# Patient Record
Sex: Female | Born: 1970 | Race: White | Hispanic: No | Marital: Married | Smoking: Current every day smoker
Health system: Southern US, Community
[De-identification: ages and names within clinical notes are randomized; demographics above are authoritative.]

## PROBLEM LIST (undated history)

## (undated) DIAGNOSIS — Z85820 Personal history of malignant melanoma of skin: Secondary | ICD-10-CM

## (undated) DIAGNOSIS — K219 Gastro-esophageal reflux disease without esophagitis: Secondary | ICD-10-CM

## (undated) DIAGNOSIS — R631 Polydipsia: Secondary | ICD-10-CM

## (undated) DIAGNOSIS — I10 Essential (primary) hypertension: Secondary | ICD-10-CM

## (undated) DIAGNOSIS — M790 Rheumatism, unspecified: Secondary | ICD-10-CM

## (undated) DIAGNOSIS — F419 Anxiety disorder, unspecified: Secondary | ICD-10-CM

## (undated) DIAGNOSIS — K52832 Lymphocytic colitis: Secondary | ICD-10-CM

## (undated) DIAGNOSIS — K52831 Collagenous colitis: Secondary | ICD-10-CM

## (undated) DIAGNOSIS — M797 Fibromyalgia: Secondary | ICD-10-CM

## (undated) HISTORY — DX: Collagenous colitis: K52.831

## (undated) HISTORY — DX: Lymphocytic colitis: K52.832

## (undated) HISTORY — DX: Anxiety disorder, unspecified: F41.9

## (undated) HISTORY — DX: Personal history of malignant melanoma of skin: Z85.820

## (undated) HISTORY — DX: Rheumatism, unspecified: M79.0

## (undated) HISTORY — DX: Essential (primary) hypertension: I10

## (undated) HISTORY — DX: Fibromyalgia: M79.7

## (undated) HISTORY — DX: Polydipsia: R63.1

## (undated) HISTORY — DX: Gastro-esophageal reflux disease without esophagitis: K21.9

---

## 2001-11-25 ENCOUNTER — Encounter: Payer: Self-pay | Admitting: Family Medicine

## 2001-11-25 ENCOUNTER — Ambulatory Visit (HOSPITAL_COMMUNITY): Admission: RE | Admit: 2001-11-25 | Discharge: 2001-11-25 | Payer: Self-pay | Admitting: Family Medicine

## 2003-09-22 ENCOUNTER — Other Ambulatory Visit: Admission: RE | Admit: 2003-09-22 | Discharge: 2003-09-22 | Payer: Self-pay | Admitting: Family Medicine

## 2004-01-12 ENCOUNTER — Other Ambulatory Visit: Admission: RE | Admit: 2004-01-12 | Discharge: 2004-01-12 | Payer: Self-pay | Admitting: Family Medicine

## 2005-01-29 ENCOUNTER — Other Ambulatory Visit: Admission: RE | Admit: 2005-01-29 | Discharge: 2005-01-29 | Payer: Self-pay | Admitting: Family Medicine

## 2011-08-13 DIAGNOSIS — Z85820 Personal history of malignant melanoma of skin: Secondary | ICD-10-CM | POA: Insufficient documentation

## 2017-06-07 ENCOUNTER — Ambulatory Visit
Admission: RE | Admit: 2017-06-07 | Discharge: 2017-06-07 | Disposition: A | Discharge status: Home / Self Care | Payer: No Typology Code available for payment source | Source: Ambulatory Visit | Attending: Family Medicine | Admitting: Family Medicine

## 2017-06-07 ENCOUNTER — Other Ambulatory Visit: Payer: Self-pay | Admitting: Family Medicine

## 2017-06-07 DIAGNOSIS — R042 Hemoptysis: Secondary | ICD-10-CM

## 2018-12-05 ENCOUNTER — Other Ambulatory Visit (HOSPITAL_COMMUNITY)
Admission: RE | Admit: 2018-12-05 | Discharge: 2018-12-05 | Disposition: A | Discharge status: Home / Self Care | Payer: PRIVATE HEALTH INSURANCE | Source: Ambulatory Visit | Attending: Physician Assistant | Admitting: Physician Assistant

## 2018-12-05 ENCOUNTER — Other Ambulatory Visit: Payer: Self-pay | Admitting: Physician Assistant

## 2018-12-05 DIAGNOSIS — Z0000 Encounter for general adult medical examination without abnormal findings: Secondary | ICD-10-CM | POA: Insufficient documentation

## 2018-12-09 LAB — CYTOLOGY - PAP
Diagnosis: NEGATIVE
HPV: NOT DETECTED

## 2018-12-15 ENCOUNTER — Other Ambulatory Visit: Payer: Self-pay | Admitting: Family Medicine

## 2018-12-15 DIAGNOSIS — Z1231 Encounter for screening mammogram for malignant neoplasm of breast: Secondary | ICD-10-CM

## 2019-01-28 ENCOUNTER — Ambulatory Visit: Payer: No Typology Code available for payment source

## 2019-03-13 ENCOUNTER — Ambulatory Visit
Admission: RE | Admit: 2019-03-13 | Discharge: 2019-03-13 | Disposition: A | Discharge status: Home / Self Care | Payer: PRIVATE HEALTH INSURANCE | Source: Ambulatory Visit | Attending: Family Medicine | Admitting: Family Medicine

## 2019-03-13 ENCOUNTER — Other Ambulatory Visit: Payer: Self-pay

## 2019-03-13 DIAGNOSIS — Z1231 Encounter for screening mammogram for malignant neoplasm of breast: Secondary | ICD-10-CM

## 2019-03-17 ENCOUNTER — Other Ambulatory Visit: Payer: Self-pay | Admitting: Family Medicine

## 2019-03-17 DIAGNOSIS — R928 Other abnormal and inconclusive findings on diagnostic imaging of breast: Secondary | ICD-10-CM

## 2019-03-24 ENCOUNTER — Ambulatory Visit
Admission: RE | Admit: 2019-03-24 | Discharge: 2019-03-24 | Disposition: A | Discharge status: Home / Self Care | Payer: PRIVATE HEALTH INSURANCE | Source: Ambulatory Visit | Attending: Family Medicine | Admitting: Family Medicine

## 2019-03-24 ENCOUNTER — Other Ambulatory Visit: Payer: Self-pay

## 2019-03-24 DIAGNOSIS — R928 Other abnormal and inconclusive findings on diagnostic imaging of breast: Secondary | ICD-10-CM

## 2020-01-20 ENCOUNTER — Encounter: Payer: Self-pay | Admitting: Physical Medicine and Rehabilitation

## 2020-01-20 ENCOUNTER — Ambulatory Visit (INDEPENDENT_AMBULATORY_CARE_PROVIDER_SITE_OTHER): Payer: PRIVATE HEALTH INSURANCE | Admitting: Physical Medicine and Rehabilitation

## 2020-01-20 ENCOUNTER — Telehealth: Payer: Self-pay

## 2020-01-20 ENCOUNTER — Other Ambulatory Visit: Payer: Self-pay

## 2020-01-20 ENCOUNTER — Ambulatory Visit (INDEPENDENT_AMBULATORY_CARE_PROVIDER_SITE_OTHER): Payer: PRIVATE HEALTH INSURANCE

## 2020-01-20 VITALS — BP 144/99 | HR 97

## 2020-01-20 DIAGNOSIS — M5416 Radiculopathy, lumbar region: Secondary | ICD-10-CM | POA: Diagnosis not present

## 2020-01-20 DIAGNOSIS — M4316 Spondylolisthesis, lumbar region: Secondary | ICD-10-CM

## 2020-01-20 DIAGNOSIS — M5441 Lumbago with sciatica, right side: Secondary | ICD-10-CM

## 2020-01-20 DIAGNOSIS — G8929 Other chronic pain: Secondary | ICD-10-CM

## 2020-01-20 DIAGNOSIS — M51369 Other intervertebral disc degeneration, lumbar region without mention of lumbar back pain or lower extremity pain: Secondary | ICD-10-CM

## 2020-01-20 DIAGNOSIS — M419 Scoliosis, unspecified: Secondary | ICD-10-CM | POA: Diagnosis not present

## 2020-01-20 DIAGNOSIS — M5136 Other intervertebral disc degeneration, lumbar region: Secondary | ICD-10-CM

## 2020-01-20 MED ORDER — DIAZEPAM 5 MG PO TABS: ORAL_TABLET | ORAL | 0 refills | Status: DC

## 2020-01-20 MED ORDER — BACLOFEN 10 MG PO TABS: ORAL_TABLET | ORAL | 0 refills | Status: DC

## 2020-01-20 NOTE — Progress Notes (Signed)
Pain in posterior right thigh. Sometimes both thighs. Pain is constant. Sometimes has buttock pain.  Numeric Pain Rating Scale and Functional Assessment Average Pain 8 Pain Right Now 5 My pain is constant Pain is worse with: standing Pain improves with: rest   In the last MONTH (on 0-10 scale) has pain interfered with the following?  1. General activity like being  able to carry out your everyday physical activities such as walking, climbing stairs, carrying groceries, or moving a chair?  Rating(6)  2. Relation with others like being able to carry out your usual social activities and roles such as  activities at home, at work and in your community. Rating(8)  3. Enjoyment of life such that you have  been bothered by emotional problems such as feeling anxious, depressed or irritable?  Rating(7)

## 2020-01-20 NOTE — Telephone Encounter (Signed)
Patient called in to notify  Her mri appt is November 20th @3 :40pm [facility]

## 2020-01-20 NOTE — Progress Notes (Deleted)
6 

## 2020-01-25 NOTE — Telephone Encounter (Signed)
Scheduled for MRI review on 11/23.

## 2020-02-11 NOTE — Progress Notes (Signed)
[name] - 49 y.o. female MRN [id number]  Date of birth: 06-Oct-1970  Office Visit Note: Visit Date: 01/20/2020 PCP: Elias Else, MD Referred by: Elias Else, MD  Subjective: Chief Complaint  Patient presents with  . Right Thigh - Pain  . Left Thigh - Pain   HPI: Kaylee Thomas is a 49 y.o. female who comes in today For new patient evaluation management at the request of Aliene Beams, MD fo chronic worsening severe right lower back pain and right posterior thigh pain worse with standing and ambulating and better at rest.  This is been ongoing now for several years but now with many many months of just worsening to the point where it is really affecting her daily life.  She has had conservative care with her primary care physician with medication management including nonsteroidal anti-inflammatories as well as Flexeril muscle relaxer and activity modification and just time.  She reports no specific injury or trauma.  No bowel or bladder dysfunction no focal weakness but feels weak in the thighs at times.  She can get bilateral anterior lateral symptoms with prolonged walking.  No history of imaging at this point.  We did obtain x-rays of the lumbar spine today this is reviewed with her using images and spine model.  The details are related below.  She has had no prior lumbar surgery.  She regularly sees a massage therapist and some type of touch therapy that she describes to me today that does help temporarily but not any long-lasting relief.  She does have home exercise program with core strengthening.  Case is complicated by anxiety and history of malignant skin melanoma but no history of metastatic disease.  She is having no fevers chills or night sweats or specific night pain.  She is intolerant of a lot of medications use for pain in general.  She has not improved really over this time with good conservative care.  She rates her pain as an 8 out of 10 constant pain but really  worse with standing and ambulating.  Classic neurogenic claudication type symptoms.  She gets some relief if she is walking if she bends forward.  She does have a positive grocery cart sign.  No history of prior lumbar injections.  Review of Systems  Musculoskeletal: Positive for back pain and joint pain.       Right more than left radicular leg pain   Otherwise per HPI.  Assessment & Plan: Visit Diagnoses:  1. Chronic bilateral low back pain with right-sided sciatica   2. Lumbar radiculopathy   3. Spondylolisthesis of lumbar region   4. Scoliosis of lumbar spine, unspecified scoliosis type   5. Other intervertebral disc degeneration, lumbar region     Plan: Findings:  Chronic worsening severe low back pain with right posterior thigh pain and sometimes anterior lateral type thigh pain more of a neurogenic claudication type symptoms.  She reports 8 out of 10 pain which severely limits her daily activities.  This is really a constant pain but worse with standing and movement.  Based on clinical history and exam and imaging this appears to be a combination of facet mediated low back pain and likely lumbar stenosis.  She has pretty significant listhesis of L4 on L5 with degenerative disc height loss at this area.  This pain has continued despite conservative care through her primary care physician with medication management activity modification and therapy.  She has had home exercises that she is continued doing  pretty well.  Her case is complicated by intolerances and allergies to pain medications and anti-inflammatory.  No red flag complaints of bowel or bladder dysfunction no prior surgery.  At this point given the history of prior melanoma history of imaging today showing pretty significant listhesis and scoliosis I think the next step is really MRI of the lumbar spine.  This would be so that we could look at potential target for interventional spine injection versus potentially surgical referral if  this was high-grade stenosis.  She does have some anxiety with getting the MRI we will give her Valium for the MRI.  I am also going to try baclofen for her lower back as a muscle relaxer antispasmodic.    Meds & Orders:  Meds ordered this encounter  Medications  . diazepam (VALIUM) 5 MG tablet    Sig: Take 1 by mouth 1 hour  pre-procedure with very light food. May bring 2nd tablet to appointment.    Dispense:  2 tablet    Refill:  0  . baclofen (LIORESAL) 10 MG tablet    Sig: Take 1/2 to 1 by mouth every 8hrs as needed for spasm    Dispense:  60 tablet    Refill:  0    Orders Placed This Encounter  Procedures  . XR Lumbar Spine 2-3 Views  . MR LUMBAR SPINE WO CONTRAST    Follow-up: Return for MRI review after completion.   Procedures: No procedures performed  No notes on file   Clinical History: No specialty comments available.   She reports that she has been smoking. She has never used smokeless tobacco. No results for input(s): HGBA1C, LABURIC in the last 8760 hours.  Objective:  VS:  HT:    WT:   BMI:     BP:(!) 144/99  HR:97bpm  TEMP: ( )  RESP:  Physical Exam Constitutional:      General: She is not in acute distress.    Appearance: Normal appearance. She is not ill-appearing.  HENT:     Head: Normocephalic and atraumatic.     Right Ear: External ear normal.     Left Ear: External ear normal.  Eyes:     Extraocular Movements: Extraocular movements intact.  Cardiovascular:     Rate and Rhythm: Normal rate.     Pulses: Normal pulses.  Musculoskeletal:        General: Tenderness present.     Cervical back: Neck supple. No tenderness.     Right lower leg: No edema.     Left lower leg: No edema.     Comments: Patient is somewhat slow to rise from a seated position and she does have concordant low back pain with facet loading and extension rotation right more than left.  Patient has good distal strength with no pain over the greater trochanters.  No clonus or  focal weakness.  She has a negative slump test bilaterally.  She does ambulate with a forward flexed lumbar spine.  Skin:    Findings: No erythema, lesion or rash.  Neurological:     General: No focal deficit present.     Mental Status: She is alert and oriented to person, place, and time.     Sensory: Sensory deficit present.     Motor: No weakness or abnormal muscle tone.     Coordination: Coordination normal.     Gait: Gait normal.  Psychiatric:        Mood and Affect: Mood normal.  Behavior: Behavior normal.     Ortho Exam  Imaging: AP and lateral 2 view lumbar spine:AP and lateral lumbar spine x-ray shows rightward convex scoliosis centered at L3 with some rotatory component.  There is well-maintained lordosis but there is grade 1 several millimeter listhesis of L4 on L5 with degenerative disc height loss.  There is facet arthropathy throughout the lower lumbar spine.  Sacroiliac joints are well-maintained without sclerosis.  Hip joints show mild degenerative change right more than left.  Past Medical/Family/Surgical/Social History: Medications & Allergies reviewed per EMR, new medications updated. Patient Active Problem List   Diagnosis Date Noted  . History of malignant melanoma of skin 08/13/2011   Past Medical History:  Diagnosis Date  . Anxiety   . Anxiety   . Collagenous colitis   . GERD (gastroesophageal reflux disease)   . History of melanoma   . Hypertension   . Lymphocytic colitis   . Polydipsia   . Rheumatism and fibrositis    Family History  Problem Relation Age of Onset  . Breast cancer Mother   . Breast cancer Maternal Aunt    History reviewed. No pertinent surgical history. Social History   Occupational History  . Not on file  Tobacco Use  . Smoking status: Current Every Day Smoker  . Smokeless tobacco: Never Used  Substance and Sexual Activity  . Alcohol use: Not on file  . Drug use: Not on file  . Sexual activity: Not on file

## 2020-02-12 ENCOUNTER — Encounter: Payer: Self-pay | Admitting: Physical Medicine and Rehabilitation

## 2020-02-12 ENCOUNTER — Telehealth: Payer: Self-pay | Admitting: Physical Medicine and Rehabilitation

## 2020-02-12 NOTE — Telephone Encounter (Signed)
Patient called requesting a call back to cancel appt. Patient states her MRI thru insurance was denied. Please call patient about this matter at [phone].

## 2020-02-13 ENCOUNTER — Other Ambulatory Visit: Payer: PRIVATE HEALTH INSURANCE

## 2020-02-15 NOTE — Telephone Encounter (Signed)
Called patient to advise that appointment has been cancelled and that more notes were submitted to insurance for reconsideration for MRI.

## 2020-02-16 ENCOUNTER — Ambulatory Visit: Payer: PRIVATE HEALTH INSURANCE | Admitting: Physical Medicine and Rehabilitation

## 2020-02-17 ENCOUNTER — Telehealth: Payer: Self-pay

## 2020-02-17 NOTE — Telephone Encounter (Signed)
Called pt back and sch OV to discuss MRI review 12/21.

## 2020-02-17 NOTE — Telephone Encounter (Signed)
Patient  called she needs to schedule a appointment for a mri review. CB:[phone]

## 2020-03-09 ENCOUNTER — Ambulatory Visit
Admission: RE | Admit: 2020-03-09 | Discharge: 2020-03-09 | Disposition: A | Discharge status: Home / Self Care | Payer: PRIVATE HEALTH INSURANCE | Source: Ambulatory Visit | Attending: Physical Medicine and Rehabilitation | Admitting: Physical Medicine and Rehabilitation

## 2020-03-15 ENCOUNTER — Other Ambulatory Visit: Payer: Self-pay

## 2020-03-15 ENCOUNTER — Encounter: Payer: Self-pay | Admitting: Physical Medicine and Rehabilitation

## 2020-03-15 ENCOUNTER — Ambulatory Visit (INDEPENDENT_AMBULATORY_CARE_PROVIDER_SITE_OTHER): Payer: PRIVATE HEALTH INSURANCE | Admitting: Physical Medicine and Rehabilitation

## 2020-03-15 ENCOUNTER — Telehealth: Payer: Self-pay | Admitting: Physical Medicine and Rehabilitation

## 2020-03-15 VITALS — BP 155/97 | HR 78

## 2020-03-15 DIAGNOSIS — M47816 Spondylosis without myelopathy or radiculopathy, lumbar region: Secondary | ICD-10-CM

## 2020-03-15 DIAGNOSIS — M5441 Lumbago with sciatica, right side: Secondary | ICD-10-CM

## 2020-03-15 DIAGNOSIS — M5442 Lumbago with sciatica, left side: Secondary | ICD-10-CM

## 2020-03-15 DIAGNOSIS — M48062 Spinal stenosis, lumbar region with neurogenic claudication: Secondary | ICD-10-CM | POA: Diagnosis not present

## 2020-03-15 DIAGNOSIS — M4316 Spondylolisthesis, lumbar region: Secondary | ICD-10-CM | POA: Diagnosis not present

## 2020-03-15 DIAGNOSIS — G894 Chronic pain syndrome: Secondary | ICD-10-CM

## 2020-03-15 DIAGNOSIS — G8929 Other chronic pain: Secondary | ICD-10-CM

## 2020-03-15 MED ORDER — DIAZEPAM 5 MG PO TABS: ORAL_TABLET | ORAL | 0 refills | Status: AC

## 2020-03-15 NOTE — Telephone Encounter (Signed)
Pt not req Auth#. 

## 2020-03-15 NOTE — Progress Notes (Signed)
 .  Numeric Pain Rating Scale and Functional Assessment Average Pain 8   In the last MONTH (on 0-10 scale) has pain interfered with the following?  1. General activity like being  able to carry out your everyday physical activities such as walking, climbing stairs, carrying groceries, or moving a chair?  Rating(8)   

## 2020-03-15 NOTE — Telephone Encounter (Signed)
Is auth needed for bilateral L4 TF? Patient is scheduled for tomorrow, 12/22 with driver.

## 2020-03-16 ENCOUNTER — Ambulatory Visit (INDEPENDENT_AMBULATORY_CARE_PROVIDER_SITE_OTHER): Payer: PRIVATE HEALTH INSURANCE | Admitting: Physical Medicine and Rehabilitation

## 2020-03-16 ENCOUNTER — Encounter: Payer: Self-pay | Admitting: Physical Medicine and Rehabilitation

## 2020-03-16 ENCOUNTER — Ambulatory Visit: Payer: Self-pay

## 2020-03-16 VITALS — BP 133/94 | HR 82

## 2020-03-16 DIAGNOSIS — M48062 Spinal stenosis, lumbar region with neurogenic claudication: Secondary | ICD-10-CM | POA: Diagnosis not present

## 2020-03-16 MED ORDER — BACLOFEN 10 MG PO TABS: ORAL_TABLET | ORAL | 1 refills | Status: AC

## 2020-03-16 MED ORDER — METHYLPREDNISOLONE ACETATE 80 MG/ML IJ SUSP
80.0000 mg | Freq: Once | INTRAMUSCULAR | Status: AC
  Administered 2020-03-16: 13:00:00 80 mg

## 2020-03-16 NOTE — Progress Notes (Signed)
Here for planned bilateral L4 TF. No changes since OV yesterday.  Numeric Pain Rating Scale and Functional Assessment Average Pain 8   In the last MONTH (on 0-10 scale) has pain interfered with the following?  1. General activity like being  able to carry out your everyday physical activities such as walking, climbing stairs, carrying groceries, or moving a chair?  Rating(8)   +Driver, -BT, -Dye Allergies.

## 2020-03-16 NOTE — Progress Notes (Signed)
Kaylee Thomas - 49 y.o. female MRN [id number]  Date of birth: July 22, 1970  Office Visit Note: Visit Date: 03/15/2020 PCP: Elias Else, MD Referred by: Elias Else, MD  Subjective: Chief Complaint  Patient presents with  . Lower Back - Pain   HPI: Kaylee Thomas is a 49 y.o. female who comes in today For evaluation management of chronic worsening low back pain with severe pain in the buttocks and bilateral right more than left hip and legs posterior lateral.  Please see our prior notes for further details and justification.  We did order an MRI of the lumbar spine and this was originally denied but ultimately under appeal was completed.  We noticed on x-ray imaging when we saw her for evaluation the first time that she did have small listhesis of L4 on L5 and was having symptoms of radicular pain particularly with walking and standing.  By way of brief review she is sent to [place] by Dr. Tracie Harrier her primary physician.  She rates her pain as an 8 out of 10 pretty significant pain.  She does get pain down both legs posterior laterally without really paresthesia.  No red flag complaints or trauma.  No changes since I last saw her.  Symptoms worse with standing and ambulating better at rest and with leaning forward.  MRI reviewed with her today with spine models and imaging.  Report is reviewed below.  Patient does have significant lumbar stenosis at L4-5 with facet arthropathy which is significant with small grade 1 listhesis.  Review of Systems  Musculoskeletal: Positive for back pain.       Bilateral hip and leg pain  All other systems reviewed and are negative.  Otherwise per HPI.  Assessment & Plan: Visit Diagnoses:    ICD-10-CM   1. Spinal stenosis of lumbar region with neurogenic claudication  M48.062   2. Spondylosis without myelopathy or radiculopathy, lumbar region  M47.816   3. Spondylolisthesis of lumbar region  M43.16   4. Chronic bilateral low back pain with bilateral  sciatica  M54.42    M54.41    G89.29   5. Chronic pain syndrome  G89.4      Plan: Findings:  Chronic worsening severe low back pain and bilateral right more than left radicular leg pain consistent with neurogenic claudication with now known stenosis at L4-5 with grade 1 listhesis and facet arthropathy.  Her symptoms persist despite conservative care with physical therapy activity modification and medication management.  Unfortunately she is intolerant of most pain medications and does have skin irritation and rash from tramadol Toradol and hydrocodone.  Her case is complicated by some underlying anxiety and possibly central sensitization syndrome such as fibromyalgia.  She is actually gotten some relief with the baclofen that we gave her.  She does want refill of that medication.  We discussed treatment of stenosis and most of her symptoms seem consistent with a stenosis more than the arthritis although I think that is present as well.  The neck step is bilateral L4 transforaminal epidural steroid injection.  Depending on relief obviously could intermittently complete these with good relief if they were well separated throughout the year.  Diagnostically she gives her a lot of relief should be a good candidate for lumbar decompression.  With small grade 1 listhesis she may not require fusion.  She really does want to avoid surgery if possible.  Again no red flag symptoms of weakness.  We will schedule her for the injection using  fluoroscopic guidance.  We will give her preprocedure Valium.    Meds & Orders:  Meds ordered this encounter  Medications  . diazepam (VALIUM) 5 MG tablet    Sig: Take 1 by mouth 1 hour  pre-procedure with very light food. May bring 2nd tablet to appointment.    Dispense:  2 tablet    Refill:  0  . baclofen (LIORESAL) 10 MG tablet    Sig: Take 1/2 to 1 by mouth every 8hrs as needed for spasm    Dispense:  60 tablet    Refill:  1   No orders of the defined types were  placed in this encounter.   Follow-up: Return for Bilateral L4 transforaminal dural steroid injection.   Procedures: No procedures performed      Clinical History: MRI LUMBAR SPINE WITHOUT CONTRAST  TECHNIQUE: Multiplanar, multisequence MR imaging of the lumbar spine was performed. No intravenous contrast was administered.  COMPARISON:  Lumbar spine radiographs 01/20/2020  FINDINGS: Segmentation:  Standard.  Alignment: Mild lumbar levoscoliosis. Facet mediated anterolisthesis of L4 on L5 measuring 4 mm.  Vertebrae: No fracture or suspicious osseous lesion. Minimal degenerative endplate and left facet edema at L4-5.  Conus medullaris and cauda equina: Conus extends to the T12-L1 level. Conus and cauda equina appear normal.  Paraspinal and other soft tissues: Unremarkable.  Disc levels:  Disc desiccation throughout the lumbar spine with exception of L5-S1. Severe disc space narrowing at L4-5.  T12-L1: Negative.  L1-2: Trace disc bulging and mild facet hypertrophy without stenosis.  L2-3: Mild facet hypertrophy without disc herniation or stenosis.  L3-4: Trace disc bulging and mild facet hypertrophy without stenosis.  L4-5: Anterolisthesis with bulging uncovered disc and severe facet and ligamentum flavum hypertrophy result in moderate to severe spinal stenosis, moderate to severe bilateral lateral recess stenosis, and severe right and moderate left neural foraminal stenosis. Potential right L4 and bilateral L5 nerve root impingement. 2 mm cyst associated with the right ligamentum flavum which does not significantly contribute to the stenosis. Small to moderate right facet joint effusion.  L5-S1: Mild facet hypertrophy without disc herniation or stenosis.  IMPRESSION: 1. Severe L4-5 facet arthrosis with grade 1 anterolisthesis and moderate to severe spinal, lateral recess, and neural foraminal stenosis. 2. Mild disc and facet degeneration  elsewhere without stenosis.   Electronically Signed   By: Sebastian Ache M.D.   On: 03/10/2020 08:33   She reports that she has been smoking. She has never used smokeless tobacco. No results for input(s): HGBA1C, LABURIC in the last 8760 hours.  Objective:  VS:  HT:    WT:   BMI:     BP:(!) 155/97  HR:78bpm  TEMP: ( )  RESP:  Physical Exam Constitutional:      General: She is not in acute distress.    Appearance: Normal appearance. She is normal weight. She is not ill-appearing.  HENT:     Head: Normocephalic and atraumatic.     Right Ear: External ear normal.     Left Ear: External ear normal.     Nose: Nose normal. No congestion or rhinorrhea.  Eyes:     Extraocular Movements: Extraocular movements intact.  Cardiovascular:     Rate and Rhythm: Normal rate.     Pulses: Normal pulses.  Pulmonary:     Effort: Pulmonary effort is normal. No respiratory distress.  Abdominal:     General: There is no distension.     Palpations: Abdomen is soft.  Musculoskeletal:  General: Tenderness present. No deformity.     Right lower leg: No edema.     Left lower leg: No edema.     Comments: Patient has good distal strength with no pain over the greater trochanters.  No clonus or focal weakness. Patient somewhat slow to rise from a seated position to full extension.  There is concordant low back pain with facet loading and lumbar spine extension rotation.  There are no definitive trigger points but the patient is somewhat tender across the lower back and PSIS.  There is no pain with hip rotation.   Skin:    Findings: No erythema, lesion or rash.  Neurological:     General: No focal deficit present.     Mental Status: She is alert and oriented to person, place, and time.     Sensory: No sensory deficit.     Motor: No weakness or abnormal muscle tone.     Coordination: Coordination normal.     Gait: Gait normal.  Psychiatric:        Mood and Affect: Mood normal.         Behavior: Behavior normal.     Ortho Exam  Imaging: No results found.  Past Medical/Family/Surgical/Social History: Medications & Allergies reviewed per EMR, new medications updated. Patient Active Problem List   Diagnosis Date Noted  . History of malignant melanoma of skin 08/13/2011   Past Medical History:  Diagnosis Date  . Anxiety   . Anxiety   . Collagenous colitis   . GERD (gastroesophageal reflux disease)   . History of melanoma   . Hypertension   . Lymphocytic colitis   . Polydipsia   . Rheumatism and fibrositis    Family History  Problem Relation Age of Onset  . Breast cancer Mother   . Breast cancer Maternal Aunt    History reviewed. No pertinent surgical history. Social History   Occupational History  . Not on file  Tobacco Use  . Smoking status: Current Every Day Smoker  . Smokeless tobacco: Never Used  Substance and Sexual Activity  . Alcohol use: Not on file  . Drug use: Not on file  . Sexual activity: Not on file

## 2020-03-16 NOTE — Progress Notes (Signed)
Kaylee Thomas - 49 y.o. female MRN [id number]  Date of birth: Jan 27, 1971  Office Visit Note: Visit Date: 03/16/2020 PCP: Elias Else, MD Referred by: Elias Else, MD  Subjective: Chief Complaint  Patient presents with  . Lower Back - Pain   HPI:  Kaylee Thomas is a 49 y.o. female who comes in today for planned Bilateral L4-L5 Lumbar epidural steroid injection with fluoroscopic guidance.  The patient has failed conservative care including home exercise, medications, time and activity modification.  This injection will be diagnostic and hopefully therapeutic.  Please see requesting physician notes for further details and justification.   ROS Otherwise per HPI.  Assessment & Plan: Visit Diagnoses:    ICD-10-CM   1. Spinal stenosis of lumbar region with neurogenic claudication  M48.062 XR C-ARM NO REPORT    Epidural Steroid injection    methylPREDNISolone acetate (DEPO-MEDROL) injection 80 mg    Plan: No additional findings.   Meds & Orders:  Meds ordered this encounter  Medications  . methylPREDNISolone acetate (DEPO-MEDROL) injection 80 mg    Orders Placed This Encounter  Procedures  . XR C-ARM NO REPORT  . Epidural Steroid injection    Follow-up: No follow-ups on file.   Procedures: No procedures performed  Lumbosacral Transforaminal Epidural Steroid Injection - Sub-Pedicular Approach with Fluoroscopic Guidance  Patient: Kaylee Thomas      Date of Birth: 1970-05-17 MRN: [id number] PCP: Elias Else, MD      Visit Date: 03/16/2020   Universal Protocol:    Date/Time: 03/16/2020  Consent Given By: the patient  Position: PRONE  Additional Comments: Vital signs were monitored before and after the procedure. Patient was prepped and draped in the usual sterile fashion. The correct patient, procedure, and site was verified.   Injection Procedure Details:   Procedure diagnoses: Spinal stenosis of lumbar region with neurogenic claudication  [M48.062]    Meds Administered:  Meds ordered this encounter  Medications  . methylPREDNISolone acetate (DEPO-MEDROL) injection 80 mg    Laterality: Bilateral  Location/Site:  L4-L5  Needle:5.0 in., 22 ga.  Short bevel or Quincke spinal needle  Needle Placement: Transforaminal  Findings:    -Comments: Excellent flow of contrast along the nerve, nerve root and into the epidural space.  Procedure Details: After squaring off the end-plates to get a true AP view, the C-arm was positioned so that an oblique view of the foramen as noted above was visualized. The target area is just inferior to the "nose of the scotty dog" or sub pedicular. The soft tissues overlying this structure were infiltrated with 2-3 ml. of 1% Lidocaine without Epinephrine.  The spinal needle was inserted toward the target using a "trajectory" view along the fluoroscope beam.  Under AP and lateral visualization, the needle was advanced so it did not puncture dura and was located close the 6 O'Clock position of the pedical in AP tracterory. Biplanar projections were used to confirm position. Aspiration was confirmed to be negative for CSF and/or blood. A 1-2 ml. volume of Isovue-250 was injected and flow of contrast was noted at each level. Radiographs were obtained for documentation purposes.   After attaining the desired flow of contrast documented above, a 0.5 to 1.0 ml test dose of 0.25% Marcaine was injected into each respective transforaminal space.  The patient was observed for 90 seconds post injection.  After no sensory deficits were reported, and normal lower extremity motor function was noted,   the above injectate was administered so that  equal amounts of the injectate were placed at each foramen (level) into the transforaminal epidural space.   Additional Comments:  The patient tolerated the procedure well Dressing: 2 x 2 sterile gauze and Band-Aid    Post-procedure details: Patient was observed during  the procedure. Post-procedure instructions were reviewed.  Patient left the clinic in stable condition.     Clinical History: MRI LUMBAR SPINE WITHOUT CONTRAST  TECHNIQUE: Multiplanar, multisequence MR imaging of the lumbar spine was performed. No intravenous contrast was administered.  COMPARISON:  Lumbar spine radiographs 01/20/2020  FINDINGS: Segmentation:  Standard.  Alignment: Mild lumbar levoscoliosis. Facet mediated anterolisthesis of L4 on L5 measuring 4 mm.  Vertebrae: No fracture or suspicious osseous lesion. Minimal degenerative endplate and left facet edema at L4-5.  Conus medullaris and cauda equina: Conus extends to the T12-L1 level. Conus and cauda equina appear normal.  Paraspinal and other soft tissues: Unremarkable.  Disc levels:  Disc desiccation throughout the lumbar spine with exception of L5-S1. Severe disc space narrowing at L4-5.  T12-L1: Negative.  L1-2: Trace disc bulging and mild facet hypertrophy without stenosis.  L2-3: Mild facet hypertrophy without disc herniation or stenosis.  L3-4: Trace disc bulging and mild facet hypertrophy without stenosis.  L4-5: Anterolisthesis with bulging uncovered disc and severe facet and ligamentum flavum hypertrophy result in moderate to severe spinal stenosis, moderate to severe bilateral lateral recess stenosis, and severe right and moderate left neural foraminal stenosis. Potential right L4 and bilateral L5 nerve root impingement. 2 mm cyst associated with the right ligamentum flavum which does not significantly contribute to the stenosis. Small to moderate right facet joint effusion.  L5-S1: Mild facet hypertrophy without disc herniation or stenosis.  IMPRESSION: 1. Severe L4-5 facet arthrosis with grade 1 anterolisthesis and moderate to severe spinal, lateral recess, and neural foraminal stenosis. 2. Mild disc and facet degeneration elsewhere without  stenosis.   Electronically Signed   By: Sebastian Ache M.D.   On: 03/10/2020 08:33     Objective:  VS:  HT:    WT:   BMI:     BP:(!) 133/94  HR:82bpm  TEMP: ( )  RESP:  Physical Exam   Imaging: No results found.

## 2020-03-16 NOTE — Procedures (Signed)
Lumbosacral Transforaminal Epidural Steroid Injection - Sub-Pedicular Approach with Fluoroscopic Guidance  Patient: Kaylee Thomas      Date of Birth: 11-04-1970 MRN: [id number] PCP: Elias Else, MD      Visit Date: 03/16/2020   Universal Protocol:    Date/Time: 03/16/2020  Consent Given By: the patient  Position: PRONE  Additional Comments: Vital signs were monitored before and after the procedure. Patient was prepped and draped in the usual sterile fashion. The correct patient, procedure, and site was verified.   Injection Procedure Details:   Procedure diagnoses: Spinal stenosis of lumbar region with neurogenic claudication [M48.062]    Meds Administered:  Meds ordered this encounter  Medications  . methylPREDNISolone acetate (DEPO-MEDROL) injection 80 mg    Laterality: Bilateral  Location/Site:  L4-L5  Needle:5.0 in., 22 ga.  Short bevel or Quincke spinal needle  Needle Placement: Transforaminal  Findings:    -Comments: Excellent flow of contrast along the nerve, nerve root and into the epidural space.  Procedure Details: After squaring off the end-plates to get a true AP view, the C-arm was positioned so that an oblique view of the foramen as noted above was visualized. The target area is just inferior to the "nose of the scotty dog" or sub pedicular. The soft tissues overlying this structure were infiltrated with 2-3 ml. of 1% Lidocaine without Epinephrine.  The spinal needle was inserted toward the target using a "trajectory" view along the fluoroscope beam.  Under AP and lateral visualization, the needle was advanced so it did not puncture dura and was located close the 6 O'Clock position of the pedical in AP tracterory. Biplanar projections were used to confirm position. Aspiration was confirmed to be negative for CSF and/or blood. A 1-2 ml. volume of Isovue-250 was injected and flow of contrast was noted at each level. Radiographs were obtained for  documentation purposes.   After attaining the desired flow of contrast documented above, a 0.5 to 1.0 ml test dose of 0.25% Marcaine was injected into each respective transforaminal space.  The patient was observed for 90 seconds post injection.  After no sensory deficits were reported, and normal lower extremity motor function was noted,   the above injectate was administered so that equal amounts of the injectate were placed at each foramen (level) into the transforaminal epidural space.   Additional Comments:  The patient tolerated the procedure well Dressing: 2 x 2 sterile gauze and Band-Aid    Post-procedure details: Patient was observed during the procedure. Post-procedure instructions were reviewed.  Patient left the clinic in stable condition.

## 2020-04-14 ENCOUNTER — Telehealth: Payer: Self-pay

## 2020-04-14 NOTE — Telephone Encounter (Signed)
Patient called she stated she received injection 12/22 and she is having leg pain she noticed the pain last week, she wants to know what's next. [phone]

## 2020-04-15 NOTE — Telephone Encounter (Signed)
Is auth needed for repeat bilateral L4 TF? Scheduled.

## 2020-04-15 NOTE — Telephone Encounter (Signed)
Bilateral L4 TF on 12/22. Please advise.

## 2020-04-15 NOTE — Telephone Encounter (Signed)
Need more info on if the injection helped and now the leg pain is back or what she is actually talking about with the leg pain.  Otherwise office visit or return to referring doctor.

## 2020-04-15 NOTE — Telephone Encounter (Signed)
Patient reports that the leg pain she had prior to the injection was greatly improved for several weeks after. Her back pain took longer to improve, but it was better. Leg pain returned last week. Ok to repeat?

## 2020-04-15 NOTE — Telephone Encounter (Signed)
Yes

## 2020-04-18 ENCOUNTER — Telehealth: Payer: Self-pay

## 2020-04-18 NOTE — Telephone Encounter (Signed)
Pt not req Auth # Ref# [id number]

## 2020-04-18 NOTE — Telephone Encounter (Signed)
Called pt and LVM #1 

## 2020-04-18 NOTE — Telephone Encounter (Signed)
Patient called she is returning your phone call CB:[phone]

## 2020-04-27 ENCOUNTER — Ambulatory Visit (INDEPENDENT_AMBULATORY_CARE_PROVIDER_SITE_OTHER): Payer: PRIVATE HEALTH INSURANCE | Admitting: Physical Medicine and Rehabilitation

## 2020-04-27 ENCOUNTER — Ambulatory Visit: Payer: Self-pay

## 2020-04-27 ENCOUNTER — Other Ambulatory Visit: Payer: Self-pay

## 2020-04-27 ENCOUNTER — Encounter: Payer: Self-pay | Admitting: Physical Medicine and Rehabilitation

## 2020-04-27 DIAGNOSIS — M48062 Spinal stenosis, lumbar region with neurogenic claudication: Secondary | ICD-10-CM | POA: Diagnosis not present

## 2020-04-27 MED ORDER — METHYLPREDNISOLONE ACETATE 80 MG/ML IJ SUSP
80.0000 mg | Freq: Once | INTRAMUSCULAR | Status: AC
  Administered 2020-04-27: 80 mg

## 2020-04-27 NOTE — Progress Notes (Signed)
Pt state lower back pain that travels posterior down both legs to her calfs. Pt state walking and standing makes the pain worse. Pt state she take pain meds to help ease the pain. Pt has hx of inj on 03/16/20 pt state it worked for two weeks.  Numeric Pain Rating Scale and Functional Assessment Average Pain 8   In the last MONTH (on 0-10 scale) has pain interfered with the following?  1. General activity like being  able to carry out your everyday physical activities such as walking, climbing stairs, carrying groceries, or moving a chair?  Rating(10)   +Driver, -BT, -Dye Allergies.

## 2020-04-27 NOTE — Patient Instructions (Signed)

## 2020-04-27 NOTE — Procedures (Signed)
Lumbosacral Transforaminal Epidural Steroid Injection - Sub-Pedicular Approach with Fluoroscopic Guidance  Patient: Kaylee Thomas      Date of Birth: Nov 07, 1970 MRN: [id number] PCP: Elias Else, MD      Visit Date: 04/27/2020   Universal Protocol:    Date/Time: 04/27/2020  Consent Given By: the patient  Position: PRONE  Additional Comments: Vital signs were monitored before and after the procedure. Patient was prepped and draped in the usual sterile fashion. The correct patient, procedure, and site was verified.   Injection Procedure Details:   Procedure diagnoses: Spinal stenosis of lumbar region with neurogenic claudication [M48.062]    Meds Administered:  Meds ordered this encounter  Medications  . methylPREDNISolone acetate (DEPO-MEDROL) injection 80 mg    Laterality: Bilateral  Location/Site:  L4-L5  Needle:5.0 in., 22 ga.  Short bevel or Quincke spinal needle  Needle Placement: Transforaminal  Findings:    -Comments: Excellent flow of contrast along the nerve, nerve root and into the epidural space.  Procedure Details: After squaring off the end-plates to get a true AP view, the C-arm was positioned so that an oblique view of the foramen as noted above was visualized. The target area is just inferior to the "nose of the scotty dog" or sub pedicular. The soft tissues overlying this structure were infiltrated with 2-3 ml. of 1% Lidocaine without Epinephrine.  The spinal needle was inserted toward the target using a "trajectory" view along the fluoroscope beam.  Under AP and lateral visualization, the needle was advanced so it did not puncture dura and was located close the 6 O'Clock position of the pedical in AP tracterory. Biplanar projections were used to confirm position. Aspiration was confirmed to be negative for CSF and/or blood. A 1-2 ml. volume of Isovue-250 was injected and flow of contrast was noted at each level. Radiographs were obtained for  documentation purposes.   After attaining the desired flow of contrast documented above, a 0.5 to 1.0 ml test dose of 0.25% Marcaine was injected into each respective transforaminal space.  The patient was observed for 90 seconds post injection.  After no sensory deficits were reported, and normal lower extremity motor function was noted,   the above injectate was administered so that equal amounts of the injectate were placed at each foramen (level) into the transforaminal epidural space.   Additional Comments:  The patient tolerated the procedure well Dressing: 2 x 2 sterile gauze and Band-Aid    Post-procedure details: Patient was observed during the procedure. Post-procedure instructions were reviewed.  Patient left the clinic in stable condition.

## 2020-04-27 NOTE — Progress Notes (Signed)
Kaylee Thomas - 50 y.o. female MRN [id number]  Date of birth: Aug 31, 1970  Office Visit Note: Visit Date: 04/27/2020 PCP: Elias Else, MD Referred by: Elias Else, MD  Subjective: Chief Complaint  Patient presents with  . Lower Back - Pain  . Left Leg - Pain  . Right Leg - Pain   HPI:  Kaylee Thomas is a 50 y.o. female who comes in today  for planned Bilateral L4-L5 Lumbar epidural steroid injection with fluoroscopic guidance.  The patient has failed conservative care including home exercise, medications, time and activity modification.  This injection will be diagnostic and hopefully therapeutic.  Please see requesting physician notes for further details and justification.   ROS Otherwise per HPI.  Assessment & Plan: Visit Diagnoses:    ICD-10-CM   1. Spinal stenosis of lumbar region with neurogenic claudication  M48.062 XR C-ARM NO REPORT    Epidural Steroid injection    methylPREDNISolone acetate (DEPO-MEDROL) injection 80 mg    Plan: No additional findings.   Meds & Orders:  Meds ordered this encounter  Medications  . methylPREDNISolone acetate (DEPO-MEDROL) injection 80 mg    Orders Placed This Encounter  Procedures  . XR C-ARM NO REPORT  . Epidural Steroid injection    Follow-up: Return in about 4 weeks (around 05/25/2020).   Procedures: No procedures performed  Lumbosacral Transforaminal Epidural Steroid Injection - Sub-Pedicular Approach with Fluoroscopic Guidance  Patient: Kaylee Thomas      Date of Birth: 1971-01-01 MRN: [id number] PCP: Elias Else, MD      Visit Date: 04/27/2020   Universal Protocol:    Date/Time: 04/27/2020  Consent Given By: the patient  Position: PRONE  Additional Comments: Vital signs were monitored before and after the procedure. Patient was prepped and draped in the usual sterile fashion. The correct patient, procedure, and site was verified.   Injection Procedure Details:   Procedure diagnoses:  Spinal stenosis of lumbar region with neurogenic claudication [M48.062]    Meds Administered:  Meds ordered this encounter  Medications  . methylPREDNISolone acetate (DEPO-MEDROL) injection 80 mg    Laterality: Bilateral  Location/Site:  L4-L5  Needle:5.0 in., 22 ga.  Short bevel or Quincke spinal needle  Needle Placement: Transforaminal  Findings:    -Comments: Excellent flow of contrast along the nerve, nerve root and into the epidural space.  Procedure Details: After squaring off the end-plates to get a true AP view, the C-arm was positioned so that an oblique view of the foramen as noted above was visualized. The target area is just inferior to the "nose of the scotty dog" or sub pedicular. The soft tissues overlying this structure were infiltrated with 2-3 ml. of 1% Lidocaine without Epinephrine.  The spinal needle was inserted toward the target using a "trajectory" view along the fluoroscope beam.  Under AP and lateral visualization, the needle was advanced so it did not puncture dura and was located close the 6 O'Clock position of the pedical in AP tracterory. Biplanar projections were used to confirm position. Aspiration was confirmed to be negative for CSF and/or blood. A 1-2 ml. volume of Isovue-250 was injected and flow of contrast was noted at each level. Radiographs were obtained for documentation purposes.   After attaining the desired flow of contrast documented above, a 0.5 to 1.0 ml test dose of 0.25% Marcaine was injected into each respective transforaminal space.  The patient was observed for 90 seconds post injection.  After no sensory deficits were reported, and  normal lower extremity motor function was noted,   the above injectate was administered so that equal amounts of the injectate were placed at each foramen (level) into the transforaminal epidural space.   Additional Comments:  The patient tolerated the procedure well Dressing: 2 x 2 sterile gauze and  Band-Aid    Post-procedure details: Patient was observed during the procedure. Post-procedure instructions were reviewed.  Patient left the clinic in stable condition.      Clinical History: MRI LUMBAR SPINE WITHOUT CONTRAST  TECHNIQUE: Multiplanar, multisequence MR imaging of the lumbar spine was performed. No intravenous contrast was administered.  COMPARISON:  Lumbar spine radiographs 01/20/2020  FINDINGS: Segmentation:  Standard.  Alignment: Mild lumbar levoscoliosis. Facet mediated anterolisthesis of L4 on L5 measuring 4 mm.  Vertebrae: No fracture or suspicious osseous lesion. Minimal degenerative endplate and left facet edema at L4-5.  Conus medullaris and cauda equina: Conus extends to the T12-L1 level. Conus and cauda equina appear normal.  Paraspinal and other soft tissues: Unremarkable.  Disc levels:  Disc desiccation throughout the lumbar spine with exception of L5-S1. Severe disc space narrowing at L4-5.  T12-L1: Negative.  L1-2: Trace disc bulging and mild facet hypertrophy without stenosis.  L2-3: Mild facet hypertrophy without disc herniation or stenosis.  L3-4: Trace disc bulging and mild facet hypertrophy without stenosis.  L4-5: Anterolisthesis with bulging uncovered disc and severe facet and ligamentum flavum hypertrophy result in moderate to severe spinal stenosis, moderate to severe bilateral lateral recess stenosis, and severe right and moderate left neural foraminal stenosis. Potential right L4 and bilateral L5 nerve root impingement. 2 mm cyst associated with the right ligamentum flavum which does not significantly contribute to the stenosis. Small to moderate right facet joint effusion.  L5-S1: Mild facet hypertrophy without disc herniation or stenosis.  IMPRESSION: 1. Severe L4-5 facet arthrosis with grade 1 anterolisthesis and moderate to severe spinal, lateral recess, and neural foraminal stenosis. 2. Mild  disc and facet degeneration elsewhere without stenosis.   Electronically Signed   By: Sebastian Ache M.D.   On: 03/10/2020 08:33     Objective:  VS:  HT:    WT:   BMI:     BP:   HR: bpm  TEMP: ( )  RESP:  Physical Exam Vitals and nursing note reviewed.  Constitutional:      General: She is not in acute distress.    Appearance: Normal appearance. She is not ill-appearing.  HENT:     Head: Normocephalic and atraumatic.     Right Ear: External ear normal.     Left Ear: External ear normal.  Eyes:     Extraocular Movements: Extraocular movements intact.  Cardiovascular:     Rate and Rhythm: Normal rate.     Pulses: Normal pulses.  Pulmonary:     Effort: Pulmonary effort is normal. No respiratory distress.  Abdominal:     General: There is no distension.     Palpations: Abdomen is soft.  Musculoskeletal:        General: Tenderness present.     Cervical back: Neck supple.     Right lower leg: No edema.     Left lower leg: No edema.     Comments: Patient has good distal strength with no pain over the greater trochanters.  No clonus or focal weakness.  Skin:    Findings: No erythema, lesion or rash.  Neurological:     General: No focal deficit present.     Mental Status: She is  alert and oriented to person, place, and time.     Sensory: No sensory deficit.     Motor: No weakness or abnormal muscle tone.     Coordination: Coordination normal.  Psychiatric:        Mood and Affect: Mood normal.        Behavior: Behavior normal.      Imaging: No results found.

## 2020-05-04 ENCOUNTER — Other Ambulatory Visit: Payer: Self-pay | Admitting: Family Medicine

## 2020-05-04 DIAGNOSIS — Z1231 Encounter for screening mammogram for malignant neoplasm of breast: Secondary | ICD-10-CM

## 2020-05-18 ENCOUNTER — Telehealth: Payer: Self-pay | Admitting: Physical Medicine and Rehabilitation

## 2020-05-18 ENCOUNTER — Encounter: Payer: Self-pay | Admitting: Physical Medicine and Rehabilitation

## 2020-05-18 ENCOUNTER — Other Ambulatory Visit: Payer: Self-pay

## 2020-05-18 ENCOUNTER — Ambulatory Visit (INDEPENDENT_AMBULATORY_CARE_PROVIDER_SITE_OTHER): Payer: PRIVATE HEALTH INSURANCE | Admitting: Physical Medicine and Rehabilitation

## 2020-05-18 VITALS — BP 140/87 | HR 83

## 2020-05-18 DIAGNOSIS — M4316 Spondylolisthesis, lumbar region: Secondary | ICD-10-CM | POA: Diagnosis not present

## 2020-05-18 DIAGNOSIS — M5416 Radiculopathy, lumbar region: Secondary | ICD-10-CM

## 2020-05-18 DIAGNOSIS — M48062 Spinal stenosis, lumbar region with neurogenic claudication: Secondary | ICD-10-CM

## 2020-05-18 NOTE — Progress Notes (Signed)
Bilateral L4 TF on 04/27/20. Relief for about a week. Great relief of pain for that week. Posterior thigh pain to knees. Staring to have pain into lower legs. Numeric Pain Rating Scale and Functional Assessment Average Pain 10   In the last MONTH (on 0-10 scale) has pain interfered with the following?  1. General activity like being  able to carry out your everyday physical activities such as walking, climbing stairs, carrying groceries, or moving a chair?  Rating(10)

## 2020-05-18 NOTE — Telephone Encounter (Signed)
Is auth needed for right L5-S1 IL? Scheduled for 2/24 with driver and no blood thinners.

## 2020-05-18 NOTE — Telephone Encounter (Signed)
Pt not req auth# 

## 2020-05-19 ENCOUNTER — Encounter: Payer: Self-pay | Admitting: Physical Medicine and Rehabilitation

## 2020-05-19 ENCOUNTER — Ambulatory Visit: Payer: Self-pay

## 2020-05-19 ENCOUNTER — Ambulatory Visit (INDEPENDENT_AMBULATORY_CARE_PROVIDER_SITE_OTHER): Payer: PRIVATE HEALTH INSURANCE | Admitting: Physical Medicine and Rehabilitation

## 2020-05-19 VITALS — BP 116/84 | HR 79

## 2020-05-19 DIAGNOSIS — M5416 Radiculopathy, lumbar region: Secondary | ICD-10-CM

## 2020-05-19 MED ORDER — BETAMETHASONE SOD PHOS & ACET 6 (3-3) MG/ML IJ SUSP
12.0000 mg | Freq: Once | INTRAMUSCULAR | Status: AC
  Administered 2020-05-19: 12 mg

## 2020-05-19 NOTE — Progress Notes (Signed)
Kaylee Thomas - 50 y.o. female MRN [id number]  Date of birth: 1970/07/04  Office Visit Note: Visit Date: 05/18/2020 PCP: Elias Else, MD Referred by: Elias Else, MD  Subjective: Chief Complaint  Patient presents with  . Lower Back - Pain   HPI: Kaylee Thomas is a 50 y.o. female who comes in today For evaluation management of chronic worsening severe low back pain and bilateral leg pain.  She is status post bilateral L4 transforaminal epidural steroid injection on 04/27/2020.  She says for about a week she had almost 100% relief in the symptoms returned.  First injection completed back in December lasted a little bit longer but did just as well from a pain relief standpoint diagnostically.  Patient has MRI findings of moderate severe multifactorial stenosis at L4-5 with small listhesis.  Otherwise she has normal-appearing spine.  She continues to do home exercises and takes Flexeril and Tylenol.  She has allergic reactions to hydrocodone Toradol and tramadol.  She has had no focal weakness no bowel or bladder symptoms no specific trauma no history of prior lumbar surgery.  She has had no red flag complaints.  Rates her pain as 10 out of 10 and limits what she can do functionally.  Does endorse some changes of pain even traveling past the knee more than it used to.  Review of Systems  Musculoskeletal: Positive for back pain.       Bilateral leg pain  All other systems reviewed and are negative.  Otherwise per HPI.  Assessment & Plan: Visit Diagnoses:    ICD-10-CM   1. Spinal stenosis of lumbar region with neurogenic claudication  M48.062 Ambulatory referral to Neurosurgery  2. Spondylolisthesis of lumbar region  M43.16 Ambulatory referral to Neurosurgery  3. Lumbar radiculopathy  M54.16 Ambulatory referral to Neurosurgery     Plan: Findings:  Chronic worsening severe 10 out of 10 pain in the low back and bilateral legs worse with standing and walking better at rest.  This  is consistent with lumbar stenosis which she has at L4-5.  Stenosis is moderate severe.  Reviewed MRI with her today.  2 injections so far diagnostically have shown 100% relief just not lasting very long.  The neck step is interlaminar injection at L4-5 as I do think there is room to do it there.  I will also make referral to Dr. Marikay Alar from a neurosurgical evaluation standpoint.  The hope would be that maybe could do just a decompression instead of a fusion but she does have a small listhesis.  We will make that referral today and will get her back in for the injection.  Case is complicated by allergies and intolerances to pain medication.    Meds & Orders: No orders of the defined types were placed in this encounter.   Orders Placed This Encounter  Procedures  . Ambulatory referral to Neurosurgery    Follow-up: Return for L4-5 interlaminar injection to the right.   Procedures: No procedures performed      Clinical History: MRI LUMBAR SPINE WITHOUT CONTRAST  TECHNIQUE: Multiplanar, multisequence MR imaging of the lumbar spine was performed. No intravenous contrast was administered.  COMPARISON:  Lumbar spine radiographs 01/20/2020  FINDINGS: Segmentation:  Standard.  Alignment: Mild lumbar levoscoliosis. Facet mediated anterolisthesis of L4 on L5 measuring 4 mm.  Vertebrae: No fracture or suspicious osseous lesion. Minimal degenerative endplate and left facet edema at L4-5.  Conus medullaris and cauda equina: Conus extends to the T12-L1 level. Conus  and cauda equina appear normal.  Paraspinal and other soft tissues: Unremarkable.  Disc levels:  Disc desiccation throughout the lumbar spine with exception of L5-S1. Severe disc space narrowing at L4-5.  T12-L1: Negative.  L1-2: Trace disc bulging and mild facet hypertrophy without stenosis.  L2-3: Mild facet hypertrophy without disc herniation or stenosis.  L3-4: Trace disc bulging and mild facet  hypertrophy without stenosis.  L4-5: Anterolisthesis with bulging uncovered disc and severe facet and ligamentum flavum hypertrophy result in moderate to severe spinal stenosis, moderate to severe bilateral lateral recess stenosis, and severe right and moderate left neural foraminal stenosis. Potential right L4 and bilateral L5 nerve root impingement. 2 mm cyst associated with the right ligamentum flavum which does not significantly contribute to the stenosis. Small to moderate right facet joint effusion.  L5-S1: Mild facet hypertrophy without disc herniation or stenosis.  IMPRESSION: 1. Severe L4-5 facet arthrosis with grade 1 anterolisthesis and moderate to severe spinal, lateral recess, and neural foraminal stenosis. 2. Mild disc and facet degeneration elsewhere without stenosis.   Electronically Signed   By: Sebastian Ache M.D.   On: 03/10/2020 08:33   She reports that she has been smoking. She has never used smokeless tobacco. No results for input(s): HGBA1C, LABURIC in the last 8760 hours.  Objective:  VS:  HT:    WT:   BMI:     BP:140/87  HR:83bpm  TEMP: ( )  RESP:  Physical Exam Vitals and nursing note reviewed.  Constitutional:      General: She is not in acute distress.    Appearance: Normal appearance. She is not ill-appearing.  HENT:     Head: Normocephalic and atraumatic.     Right Ear: External ear normal.     Left Ear: External ear normal.  Eyes:     Extraocular Movements: Extraocular movements intact.  Cardiovascular:     Rate and Rhythm: Normal rate.     Pulses: Normal pulses.  Pulmonary:     Effort: Pulmonary effort is normal. No respiratory distress.  Abdominal:     General: There is no distension.     Palpations: Abdomen is soft.  Musculoskeletal:        General: Tenderness present.     Cervical back: Neck supple.     Right lower leg: No edema.     Left lower leg: No edema.     Comments: Patient has good distal strength with no pain  over the greater trochanters.  No clonus or focal weakness.  Skin:    Findings: No erythema, lesion or rash.  Neurological:     General: No focal deficit present.     Mental Status: She is alert and oriented to person, place, and time.     Sensory: No sensory deficit.     Motor: No weakness or abnormal muscle tone.     Coordination: Coordination normal.  Psychiatric:        Mood and Affect: Mood normal.        Behavior: Behavior normal.     Ortho Exam  Imaging: No results found.  Past Medical/Family/Surgical/Social History: Medications & Allergies reviewed per EMR, new medications updated. Patient Active Problem List   Diagnosis Date Noted  . History of malignant melanoma of skin 08/13/2011   Past Medical History:  Diagnosis Date  . Anxiety   . Anxiety   . Collagenous colitis   . GERD (gastroesophageal reflux disease)   . History of melanoma   . Hypertension   .  Lymphocytic colitis   . Polydipsia   . Rheumatism and fibrositis    Family History  Problem Relation Age of Onset  . Breast cancer Mother   . Breast cancer Maternal Aunt    History reviewed. No pertinent surgical history. Social History   Occupational History  . Not on file  Tobacco Use  . Smoking status: Current Every Day Smoker  . Smokeless tobacco: Never Used  Substance and Sexual Activity  . Alcohol use: Not on file  . Drug use: Not on file  . Sexual activity: Not on file

## 2020-05-19 NOTE — Progress Notes (Signed)
Pt state Lower back pain that travels to the postier of both legs. Pt state she feels pain mostly on the right side and back side of her knees to her calf. Pt state standing, climbing stairs and walking makes the pain worse. Pt state she take pain meds to help ease her pain. Pt has hx of inj on 04/27/20 Pt state it helped and lasted for two weeks.  Numeric Pain Rating Scale and Functional Assessment Average Pain 8   In the last MONTH (on 0-10 scale) has pain interfered with the following?  1. General activity like being  able to carry out your everyday physical activities such as walking, climbing stairs, carrying groceries, or moving a chair?  Rating(10)   +Driver, -BT, -Dye Allergies.

## 2020-05-19 NOTE — Patient Instructions (Signed)

## 2020-05-19 NOTE — Progress Notes (Signed)
Kaylee Thomas - 50 y.o. female MRN [id number]  Date of birth: [birth year]  Office Visit Note: Visit Date: 05/19/2020 PCP: Elias Else, MD Referred by: Elias Else, MD  Subjective: Chief Complaint  Patient presents with  . Right Knee - Pain  . Left Knee - Pain  . Left Leg - Pain  . Right Leg - Pain  . Lower Back - Pain   HPI:  Kaylee Thomas is a 50 y.o. female who comes in today for planned Right L4-L5 Lumbar epidural steroid injection with fluoroscopic guidance.  The patient has failed conservative care including home exercise, medications, time and activity modification.  This injection will be diagnostic and hopefully therapeutic.  Please see requesting physician notes for further details and justification.   ROS Otherwise per HPI.  Assessment & Plan: Visit Diagnoses:    ICD-10-CM   1. Lumbar radiculopathy  M54.16 XR C-ARM NO REPORT    Epidural Steroid injection    betamethasone acetate-betamethasone sodium phosphate (CELESTONE) injection 12 mg    Plan: No additional findings.   Meds & Orders:  Meds ordered this encounter  Medications  . betamethasone acetate-betamethasone sodium phosphate (CELESTONE) injection 12 mg    Orders Placed This Encounter  Procedures  . XR C-ARM NO REPORT  . Epidural Steroid injection    Follow-up: Return if symptoms worsen or fail to improve.   Procedures: No procedures performed  Lumbar Epidural Steroid Injection - Interlaminar Approach with Fluoroscopic Guidance  Patient: Kaylee Thomas      Date of Birth: 25-Nov-1970 MRN: [id number] PCP: Elias Else, MD      Visit Date: 05/19/2020   Universal Protocol:     Consent Given By: the patient  Position: PRONE  Additional Comments: Vital signs were monitored before and after the procedure. Patient was prepped and draped in the usual sterile fashion. The correct patient, procedure, and site was verified.   Injection Procedure Details:   Procedure  diagnoses: Lumbar radiculopathy [M54.16]   Meds Administered:  Meds ordered this encounter  Medications  . betamethasone acetate-betamethasone sodium phosphate (CELESTONE) injection 12 mg     Laterality: Right  Location/Site:  L4-L5  Needle: 3.5 in., 20 ga. Tuohy  Needle Placement: Paramedian epidural  Findings:   -Comments: Excellent flow of contrast into the epidural space.  Procedure Details: Using a paramedian approach from the side mentioned above, the region overlying the inferior lamina was localized under fluoroscopic visualization and the soft tissues overlying this structure were infiltrated with 4 ml. of 1% Lidocaine without Epinephrine. The Tuohy needle was inserted into the epidural space using a paramedian approach.   The epidural space was localized using loss of resistance along with counter oblique bi-planar fluoroscopic views.  After negative aspirate for air, blood, and CSF, a 2 ml. volume of Isovue-250 was injected into the epidural space and the flow of contrast was observed. Radiographs were obtained for documentation purposes.    The injectate was administered into the level noted above.   Additional Comments:  The patient tolerated the procedure well Dressing: 2 x 2 sterile gauze and Band-Aid    Post-procedure details: Patient was observed during the procedure. Post-procedure instructions were reviewed.  Patient left the clinic in stable condition.     Clinical History: MRI LUMBAR SPINE WITHOUT CONTRAST  TECHNIQUE: Multiplanar, multisequence MR imaging of the lumbar spine was performed. No intravenous contrast was administered.  COMPARISON:  Lumbar spine radiographs 01/20/2020  FINDINGS: Segmentation:  Standard.  Alignment: Mild lumbar levoscoliosis.  Facet mediated anterolisthesis of L4 on L5 measuring 4 mm.  Vertebrae: No fracture or suspicious osseous lesion. Minimal degenerative endplate and left facet edema at L4-5.  Conus  medullaris and cauda equina: Conus extends to the T12-L1 level. Conus and cauda equina appear normal.  Paraspinal and other soft tissues: Unremarkable.  Disc levels:  Disc desiccation throughout the lumbar spine with exception of L5-S1. Severe disc space narrowing at L4-5.  T12-L1: Negative.  L1-2: Trace disc bulging and mild facet hypertrophy without stenosis.  L2-3: Mild facet hypertrophy without disc herniation or stenosis.  L3-4: Trace disc bulging and mild facet hypertrophy without stenosis.  L4-5: Anterolisthesis with bulging uncovered disc and severe facet and ligamentum flavum hypertrophy result in moderate to severe spinal stenosis, moderate to severe bilateral lateral recess stenosis, and severe right and moderate left neural foraminal stenosis. Potential right L4 and bilateral L5 nerve root impingement. 2 mm cyst associated with the right ligamentum flavum which does not significantly contribute to the stenosis. Small to moderate right facet joint effusion.  L5-S1: Mild facet hypertrophy without disc herniation or stenosis.  IMPRESSION: 1. Severe L4-5 facet arthrosis with grade 1 anterolisthesis and moderate to severe spinal, lateral recess, and neural foraminal stenosis. 2. Mild disc and facet degeneration elsewhere without stenosis.   Electronically Signed   By: Sebastian Ache M.D.   On: 03/10/2020 08:33     Objective:  VS:  HT:    WT:   BMI:     BP:116/84  HR:79bpm  TEMP: ( )  RESP:  Physical Exam Vitals and nursing note reviewed.  Constitutional:      General: She is not in acute distress.    Appearance: Normal appearance. She is not ill-appearing.  HENT:     Head: Normocephalic and atraumatic.     Right Ear: External ear normal.     Left Ear: External ear normal.  Eyes:     Extraocular Movements: Extraocular movements intact.  Cardiovascular:     Rate and Rhythm: Normal rate.     Pulses: Normal pulses.  Pulmonary:     Effort:  Pulmonary effort is normal. No respiratory distress.  Abdominal:     General: There is no distension.     Palpations: Abdomen is soft.  Musculoskeletal:        General: Tenderness present.     Cervical back: Neck supple.     Right lower leg: No edema.     Left lower leg: No edema.     Comments: Patient has good distal strength with no pain over the greater trochanters.  No clonus or focal weakness.  Skin:    Findings: No erythema, lesion or rash.  Neurological:     General: No focal deficit present.     Mental Status: She is alert and oriented to person, place, and time.     Sensory: No sensory deficit.     Motor: No weakness or abnormal muscle tone.     Coordination: Coordination normal.  Psychiatric:        Mood and Affect: Mood normal.        Behavior: Behavior normal.      Imaging: No results found.

## 2020-05-19 NOTE — Procedures (Signed)
Lumbar Epidural Steroid Injection - Interlaminar Approach with Fluoroscopic Guidance  Patient: Kaylee Thomas      Date of Birth: 06-04-1970 MRN: [id number] PCP: Elias Else, MD      Visit Date: 05/19/2020   Universal Protocol:     Consent Given By: the patient  Position: PRONE  Additional Comments: Vital signs were monitored before and after the procedure. Patient was prepped and draped in the usual sterile fashion. The correct patient, procedure, and site was verified.   Injection Procedure Details:   Procedure diagnoses: Lumbar radiculopathy [M54.16]   Meds Administered:  Meds ordered this encounter  Medications  . betamethasone acetate-betamethasone sodium phosphate (CELESTONE) injection 12 mg     Laterality: Right  Location/Site:  L4-L5  Needle: 3.5 in., 20 ga. Tuohy  Needle Placement: Paramedian epidural  Findings:   -Comments: Excellent flow of contrast into the epidural space.  Procedure Details: Using a paramedian approach from the side mentioned above, the region overlying the inferior lamina was localized under fluoroscopic visualization and the soft tissues overlying this structure were infiltrated with 4 ml. of 1% Lidocaine without Epinephrine. The Tuohy needle was inserted into the epidural space using a paramedian approach.   The epidural space was localized using loss of resistance along with counter oblique bi-planar fluoroscopic views.  After negative aspirate for air, blood, and CSF, a 2 ml. volume of Isovue-250 was injected into the epidural space and the flow of contrast was observed. Radiographs were obtained for documentation purposes.    The injectate was administered into the level noted above.   Additional Comments:  The patient tolerated the procedure well Dressing: 2 x 2 sterile gauze and Band-Aid    Post-procedure details: Patient was observed during the procedure. Post-procedure instructions were reviewed.  Patient left the  clinic in stable condition.

## 2020-06-17 ENCOUNTER — Telehealth: Payer: Self-pay

## 2020-06-17 NOTE — Telephone Encounter (Signed)
patient called she stated she was referred to Dr.James and he  stated she may need surgery patient would like to know if a chiropractor would be a option patient would like a call back to discuss options :[phone]

## 2020-06-20 NOTE — Telephone Encounter (Signed)
She can always see a chiropractor, we can a script in case insurance will cover.

## 2020-06-20 NOTE — Telephone Encounter (Signed)
Please advise 

## 2020-06-20 NOTE — Telephone Encounter (Signed)
Called patient to advise. She will cal [place] if she needs an rx.

## 2020-06-23 ENCOUNTER — Ambulatory Visit: Payer: PRIVATE HEALTH INSURANCE

## 2020-06-30 ENCOUNTER — Ambulatory Visit: Payer: PRIVATE HEALTH INSURANCE

## 2020-07-20 ENCOUNTER — Inpatient Hospital Stay: Admission: RE | Admit: 2020-07-20 | Payer: PRIVATE HEALTH INSURANCE | Source: Ambulatory Visit

## 2020-09-06 ENCOUNTER — Ambulatory Visit
Admission: RE | Admit: 2020-09-06 | Discharge: 2020-09-06 | Disposition: A | Discharge status: Home / Self Care | Payer: PRIVATE HEALTH INSURANCE | Source: Ambulatory Visit | Attending: Family Medicine | Admitting: Family Medicine

## 2020-09-06 ENCOUNTER — Other Ambulatory Visit: Payer: Self-pay

## 2020-09-06 DIAGNOSIS — Z1231 Encounter for screening mammogram for malignant neoplasm of breast: Secondary | ICD-10-CM

## 2020-11-04 IMAGING — MG DIGITAL SCREENING BILAT W/ TOMO W/ CAD
8 series · 9 of 24 positions shown · non-contrast
Comparison: None.

CLINICAL DATA: Screening.

EXAM:
DIGITAL SCREENING BILATERAL MAMMOGRAM WITH TOMO AND CAD

[L CC synth-2D]
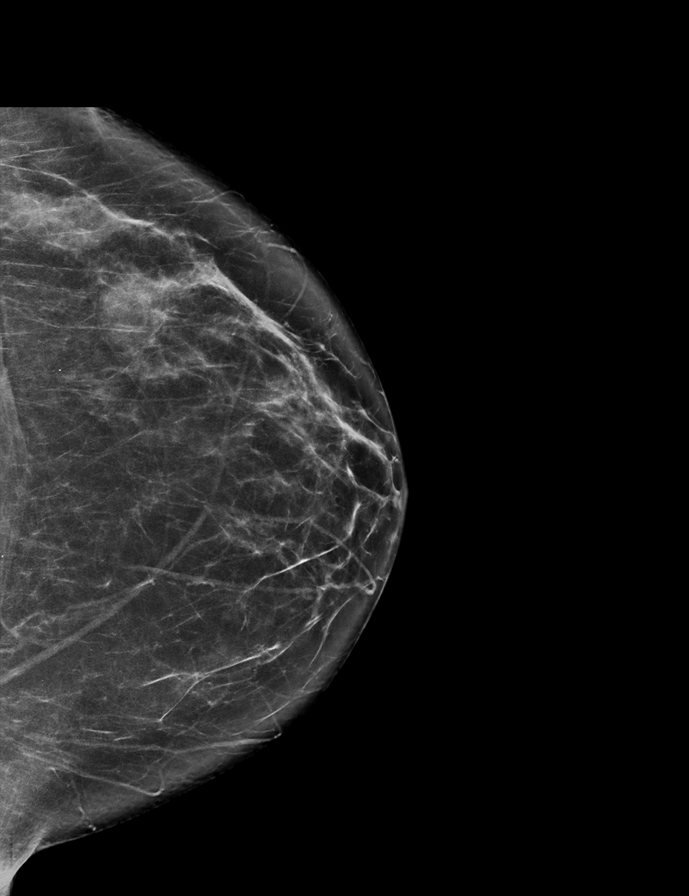

[R CC synth-2D]
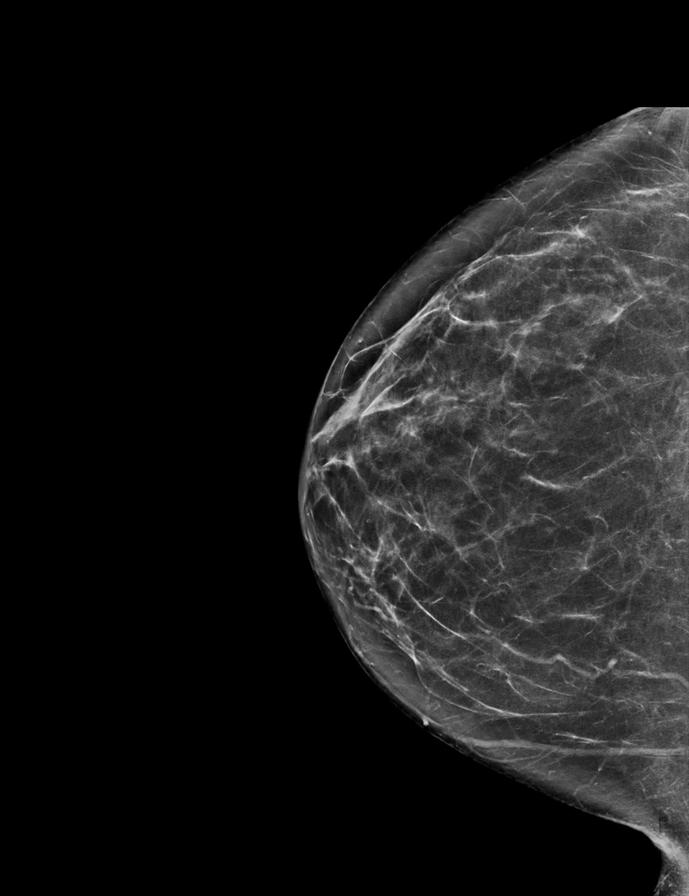

[R MLO synth-2D]
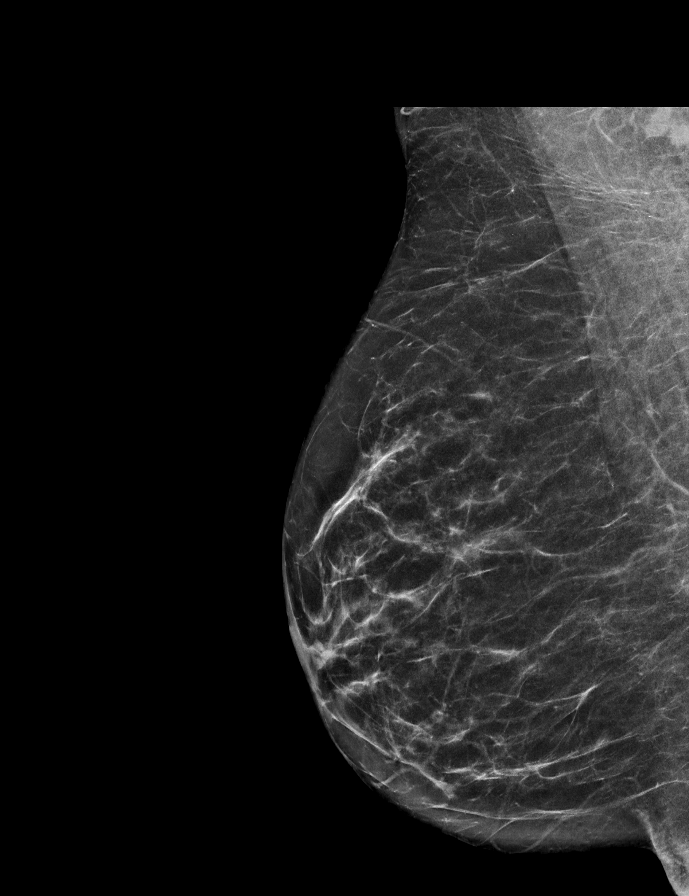

[L MLO synth-2D]
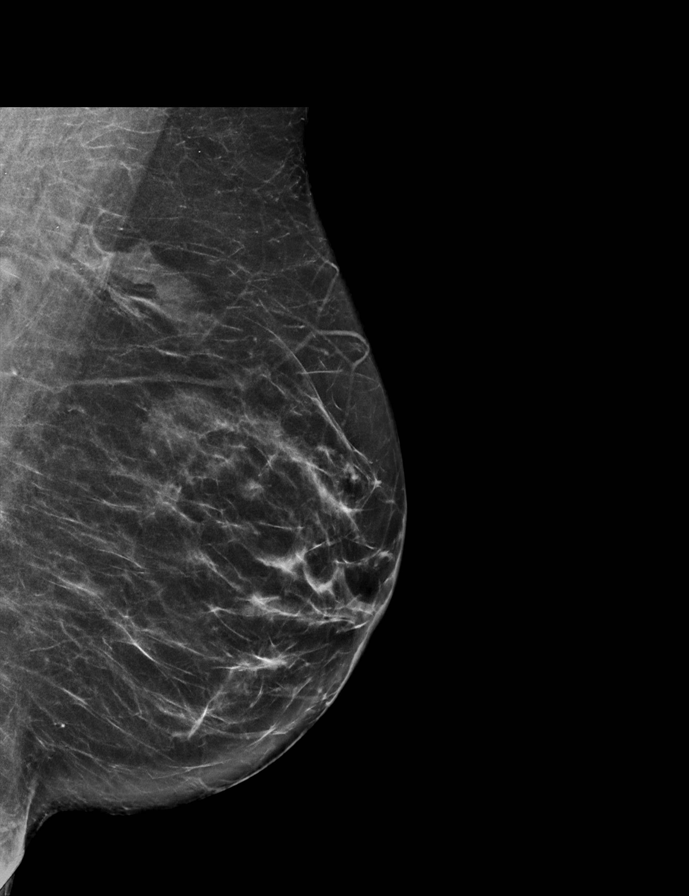

[L MLO tomo · 2 of 68 frames shown]
[frame 22/68]
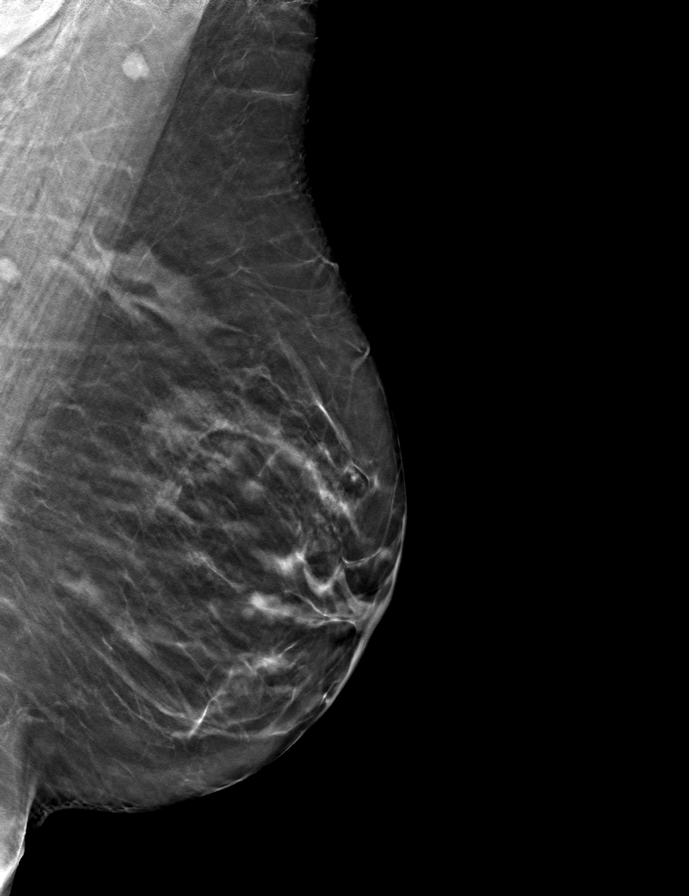
[frame 35/68]
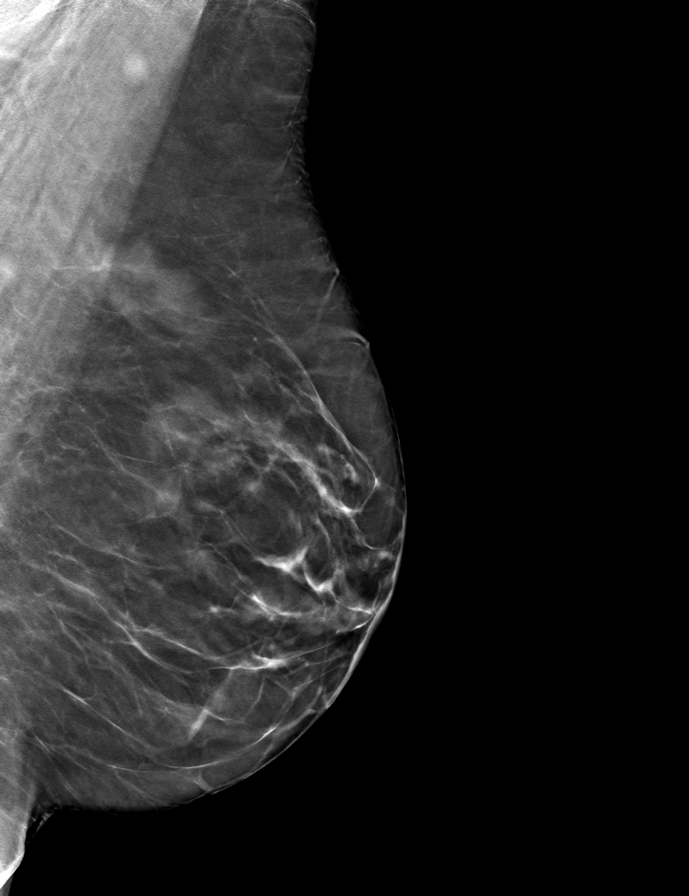

[L CC tomo · tomo slice 35/68.0]
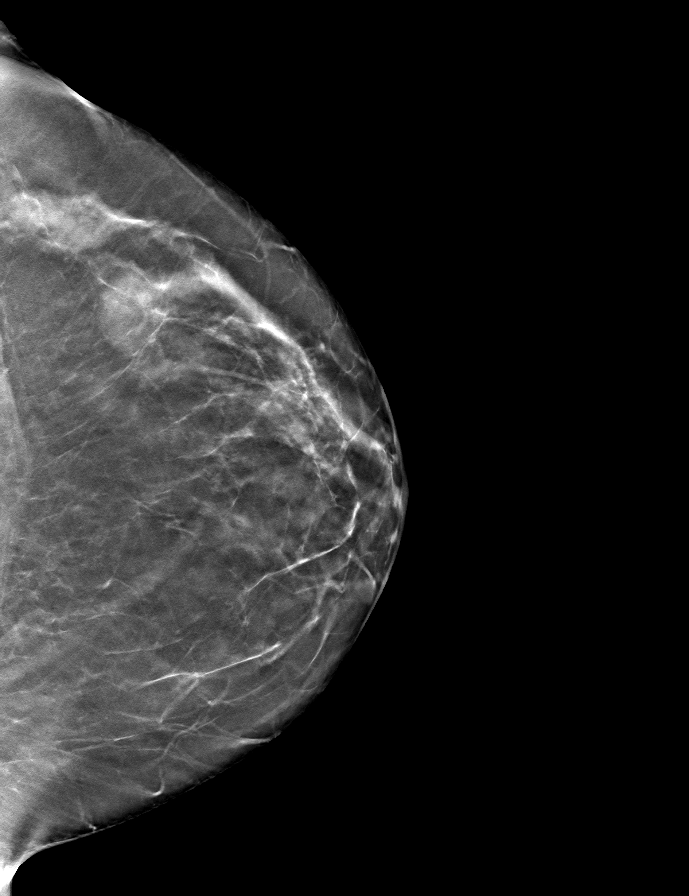

[R MLO tomo · tomo slice 32/63.0]
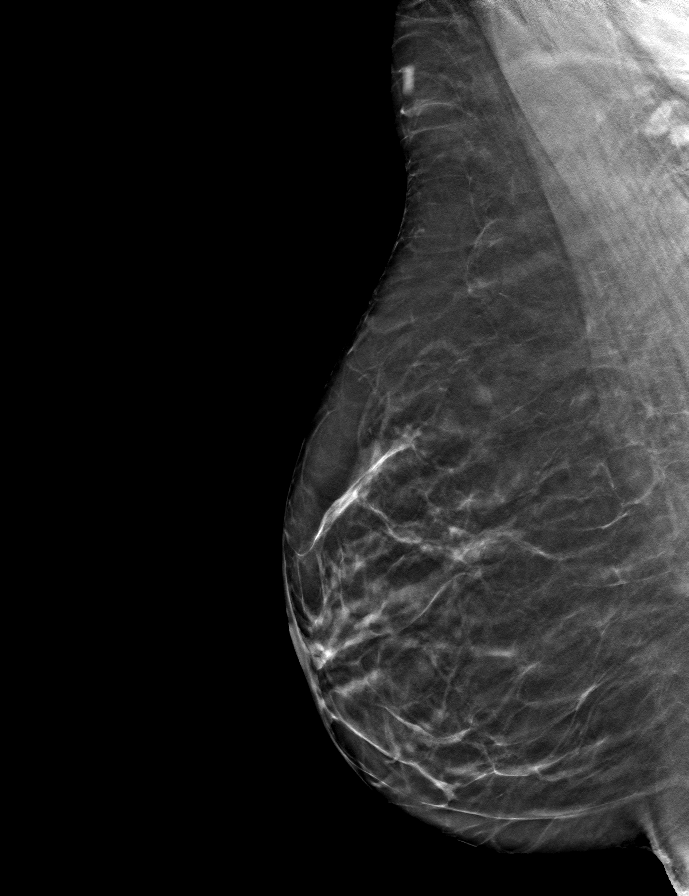

[R CC tomo · tomo slice 33/65.0]
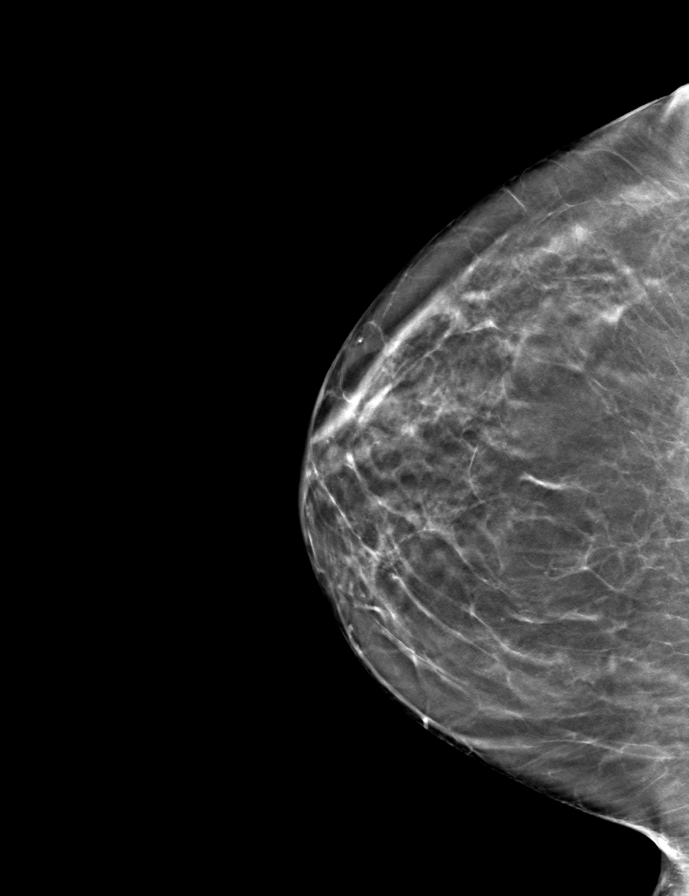

[9 of 24 positions shown; findings below may reference images not displayed]

ACR Breast Density Category b: There are scattered areas of
fibroglandular density.
FINDINGS: In the left breast, a possible asymmetry warrants further
evaluation. In the right breast, no findings suspicious for
malignancy. Images were processed with CAD.
IMPRESSION: Further evaluation is suggested for possible asymmetry in the left
breast.

RECOMMENDATION:
Diagnostic mammogram and possibly ultrasound of the left breast.
(Code:[id number]-[id number])

The patient will be contacted regarding the findings, and additional
imaging will be scheduled.

BI-RADS CATEGORY  0: Incomplete. Need additional imaging evaluation
and/or prior mammograms for comparison.

## 2021-09-19 ENCOUNTER — Other Ambulatory Visit: Payer: Self-pay | Admitting: Family Medicine

## 2021-09-19 DIAGNOSIS — Z1231 Encounter for screening mammogram for malignant neoplasm of breast: Secondary | ICD-10-CM

## 2021-10-23 ENCOUNTER — Ambulatory Visit
Admission: RE | Admit: 2021-10-23 | Discharge: 2021-10-23 | Disposition: A | Discharge status: Home / Self Care | Payer: No Typology Code available for payment source | Source: Ambulatory Visit | Attending: Family Medicine | Admitting: Family Medicine

## 2021-10-23 DIAGNOSIS — Z1231 Encounter for screening mammogram for malignant neoplasm of breast: Secondary | ICD-10-CM

## 2022-11-29 ENCOUNTER — Other Ambulatory Visit: Payer: Self-pay | Admitting: Family Medicine

## 2022-11-29 DIAGNOSIS — Z1231 Encounter for screening mammogram for malignant neoplasm of breast: Secondary | ICD-10-CM

## 2022-12-21 ENCOUNTER — Ambulatory Visit
Admission: RE | Admit: 2022-12-21 | Discharge: 2022-12-21 | Disposition: A | Discharge status: Home / Self Care | Payer: No Typology Code available for payment source | Source: Ambulatory Visit | Attending: Family Medicine | Admitting: Family Medicine

## 2022-12-21 DIAGNOSIS — Z1231 Encounter for screening mammogram for malignant neoplasm of breast: Secondary | ICD-10-CM

## 2023-07-12 ENCOUNTER — Other Ambulatory Visit: Payer: Self-pay | Admitting: Family Medicine

## 2023-07-12 DIAGNOSIS — R519 Headache, unspecified: Secondary | ICD-10-CM

## 2023-08-20 ENCOUNTER — Other Ambulatory Visit
